# Patient Record
Sex: Female | Born: 1990 | Race: Black or African American | Hispanic: No | Marital: Married | Smoking: Current some day smoker
Health system: Southern US, Community
[De-identification: ages and names within clinical notes are randomized; demographics above are authoritative.]

## PROBLEM LIST (undated history)

## (undated) DIAGNOSIS — M069 Rheumatoid arthritis, unspecified: Secondary | ICD-10-CM

---

## 2014-01-30 ENCOUNTER — Emergency Department (HOSPITAL_COMMUNITY)
Admission: EM | Admit: 2014-01-30 | Discharge: 2014-01-30 | Disposition: A | Discharge status: Home / Self Care | Payer: Medicaid Other | Attending: Emergency Medicine | Admitting: Emergency Medicine

## 2014-01-30 ENCOUNTER — Encounter (HOSPITAL_COMMUNITY): Payer: Self-pay | Admitting: Cardiology

## 2014-01-30 DIAGNOSIS — O0932 Supervision of pregnancy with insufficient antenatal care, second trimester: Secondary | ICD-10-CM | POA: Insufficient documentation

## 2014-01-30 DIAGNOSIS — O2392 Unspecified genitourinary tract infection in pregnancy, second trimester: Secondary | ICD-10-CM | POA: Diagnosis present

## 2014-01-30 DIAGNOSIS — Z3A25 25 weeks gestation of pregnancy: Secondary | ICD-10-CM | POA: Insufficient documentation

## 2014-01-30 DIAGNOSIS — N390 Urinary tract infection, site not specified: Secondary | ICD-10-CM

## 2014-01-30 DIAGNOSIS — Z3490 Encounter for supervision of normal pregnancy, unspecified, unspecified trimester: Secondary | ICD-10-CM

## 2014-01-30 LAB — BASIC METABOLIC PANEL
Anion gap: 10 (ref 5–15)
CO2: 21 mmol/L (ref 19–32)
Calcium: 8.5 mg/dL (ref 8.4–10.5)
Chloride: 101 mEq/L (ref 96–112)
Creatinine, Ser: 0.62 mg/dL (ref 0.50–1.10)
GFR calc Af Amer: >90 mL/min (ref >90)
GLUCOSE: 72 mg/dL (ref 70–99)
POTASSIUM: 3.6 mmol/L (ref 3.5–5.1)
Sodium: 132 mmol/L — ABNORMAL LOW (ref 135–145)

## 2014-01-30 LAB — CBC WITH DIFFERENTIAL/PLATELET
BASOS ABS: 0 10*3/uL (ref 0.0–0.1)
Basophils Relative: 0 % (ref 0–1)
Eosinophils Absolute: 0 10*3/uL (ref 0.0–0.7)
Eosinophils Relative: 0 % (ref 0–5)
HCT: 37.8 % (ref 36.0–46.0)
HEMOGLOBIN: 12.3 g/dL (ref 12.0–15.0)
LYMPHS PCT: 13 % (ref 12–46)
Lymphs Abs: 1.6 10*3/uL (ref 0.7–4.0)
MCH: 26.7 pg (ref 26.0–34.0)
MCHC: 32.5 g/dL (ref 30.0–36.0)
MCV: 82.2 fL (ref 78.0–100.0)
MONOS PCT: 10 % (ref 3–12)
Monocytes Absolute: 1.2 10*3/uL — ABNORMAL HIGH (ref 0.1–1.0)
NEUTROS ABS: 9.5 10*3/uL — AB (ref 1.7–7.7)
Neutrophils Relative %: 77 % (ref 43–77)
Platelets: 262 10*3/uL (ref 150–400)
RBC: 4.6 MIL/uL (ref 3.87–5.11)
RDW: 13.9 % (ref 11.5–15.5)
WBC: 12.4 10*3/uL — ABNORMAL HIGH (ref 4.0–10.5)

## 2014-01-30 LAB — URINALYSIS, ROUTINE W REFLEX MICROSCOPIC
Bilirubin Urine: NEGATIVE
GLUCOSE, UA: NEGATIVE mg/dL
Ketones, ur: 15 mg/dL — AB
NITRITE: NEGATIVE
PH: 6.5 (ref 5.0–8.0)
Protein, ur: NEGATIVE mg/dL
SPECIFIC GRAVITY, URINE: 1.013 (ref 1.005–1.030)
Urobilinogen, UA: 1 mg/dL (ref 0.0–1.0)

## 2014-01-30 LAB — URINE MICROSCOPIC-ADD ON

## 2014-01-30 LAB — I-STAT BETA HCG BLOOD, ED (MC, WL, AP ONLY)

## 2014-01-30 MED ORDER — CEPHALEXIN 500 MG PO CAPS: 500.0000 mg | ORAL_CAPSULE | Freq: Four times a day (QID) | ORAL | Status: DC

## 2014-01-30 MED ORDER — CEFTRIAXONE SODIUM 1 G IJ SOLR
1.0000 g | Freq: Once | INTRAMUSCULAR | Status: AC
  Administered 2014-01-30: 1 g via INTRAMUSCULAR
  Filled 2014-01-30: qty 10

## 2014-01-30 MED ORDER — STERILE WATER FOR INJECTION IJ SOLN
INTRAMUSCULAR | Status: AC
  Administered 2014-01-30: 2.1 mL
  Filled 2014-01-30: qty 10

## 2014-01-30 NOTE — ED Notes (Signed)
Pt reports that she thinks that she is [redacted] weeks pregnant but has no had any prenatal care at this point. States that she would like to have an [place].

## 2014-01-30 NOTE — ED Provider Notes (Signed)
CSN: [id number]     Arrival date & time 01/30/14  1147 History   None    Chief Complaint  Patient presents with  . Pregnancy Ultrasound     (Consider location/radiation/quality/duration/timing/severity/associated sxs/prior Treatment) HPI   Molly Finley is a 24 y.o. female who is G3 P1 last menstrual period approximately 5-1/2 months ago requesting OB ultrasound. Patient states that she chose today to have prenatal care because she was visiting her fianc in the hospital and it was convenient. She denies any vaginal bleeding, abdominal pain, abnormal vaginal discharge, dysuria, hematuria. She is irregularly taking prenatal vitamins. She is a intermittent tobacco user.  Patient states that her OB/GYN's [facility] in [place]   History reviewed. No pertinent past medical history. Past Surgical History  Procedure Laterality Date  . Cesarean section     History reviewed. No pertinent family history. History  Substance Use Topics  . Smoking status: Current Some Day Smoker  . Smokeless tobacco: Not on file  . Alcohol Use: No   OB History    Gravida Para Term Preterm AB TAB SAB Ectopic Multiple Living   1              Review of Systems  10 systems reviewed and found to be negative, except as noted in the HPI.   Allergies  Review of patient's allergies indicates no known allergies.  Home Medications   Prior to Admission medications   Not on File   BP 103/75 mmHg  Pulse 87  Temp(Src) 98.1 F (36.7 C) (Oral)  Resp 18  SpO2 99% Physical Exam  Constitutional: She is oriented to person, place, and time. She appears well-developed and well-nourished. No distress.  HENT:  Head: Normocephalic and atraumatic.  Mouth/Throat: Oropharynx is clear and moist.  Eyes: Conjunctivae and EOM are normal. Pupils are equal, round, and reactive to light.  Neck: Normal range of motion.  Cardiovascular: Normal rate, regular rhythm and intact distal pulses.    Pulmonary/Chest: Effort normal. No stridor.  Abdominal: Soft. Bowel sounds are normal. She exhibits no mass. There is no tenderness. There is no rebound and no guarding.  Gravid with fundal height well above the umbilicus. No significant tenderness to palpation.   Musculoskeletal: Normal range of motion.  Neurological: She is alert and oriented to person, place, and time.  Psychiatric: She has a normal mood and affect.  Nursing note and vitals reviewed.   ED Course  Procedures (including critical care time) Labs Review Labs Reviewed  URINALYSIS, ROUTINE W REFLEX MICROSCOPIC - Abnormal; Notable for the following:    Color, Urine STRAW (*)    APPearance CLOUDY (*)    Hgb urine dipstick MODERATE (*)    Ketones, ur 15 (*)    Leukocytes, UA MODERATE (*)    All other components within normal limits  CBC WITH DIFFERENTIAL - Abnormal; Notable for the following:    WBC 12.4 (*)    Neutro Abs 9.5 (*)    Monocytes Absolute 1.2 (*)    All other components within normal limits  BASIC METABOLIC PANEL - Abnormal; Notable for the following:    Sodium 132 (*)    BUN <5 (*)    All other components within normal limits  URINE MICROSCOPIC-ADD ON - Abnormal; Notable for the following:    Squamous Epithelial / LPF FEW (*)    Bacteria, UA MANY (*)    All other components within normal limits  I-STAT BETA HCG BLOOD, ED ([facility], [facility], [facility] ONLY) -  Abnormal; Notable for the following:    I-stat hCG, quantitative >2000.0 (*)    All other components within normal limits  URINE CULTURE  HIV ANTIBODY (ROUTINE TESTING)    Imaging Review No results found.   EKG Interpretation None      MDM   Final diagnoses:  UTI (lower urinary tract infection)  Pregnancy  Insufficient prenatal care in second trimester    Filed Vitals:   01/30/14 1202  BP: 103/75  Pulse: 87  Temp: 98.1 F (36.7 C)  TempSrc: Oral  Resp: 18  SpO2: 99%    Medications  cefTRIAXone (ROCEPHIN) injection 1 g (1 g Intramuscular  Given 01/30/14 1718)  sterile water (preservative free) injection (2.1 mLs  Given 01/30/14 1719)    Molly Finley is a pleasant 24 y.o. female requesting ultrasound to evaluate her pregnancy. Patient is approximately [redacted] weeks pregnant, she has had no prenatal care. She denies any abdominal pain, vaginal bleeding, abnormal vaginal discharge. States that she's been too busy to obtain prenatal care. Urinalysis is consistent with infection, patient will be given a gram of Ceftin and started on Keflex. Urine culture is ordered. I've had an extensive discussion with this patient on the importance of prenatal care. She states that she will call her OB/GYN tomorrow states that she can get into see them quickly as they manage her 2 other pregnancies.  Bedside ultrasound performed by attending physician Dr. Gwendolyn Grant shows normal fetal heart tones and gestational age by biparietal diameter between 30 and 32 weeks.  Evaluation does not show pathology that would require ongoing emergent intervention or inpatient treatment. Pt is hemodynamically stable and mentating appropriately. Discussed findings and plan with patient/guardian, who agrees with care plan. All questions answered. Return precautions discussed and outpatient follow up given.   Discharge Medication List as of 01/30/2014  5:43 PM    START taking these medications   Details  cephALEXin (KEFLEX) 500 MG capsule Take 1 capsule (500 mg total) by mouth 4 (four) times daily., Starting 01/30/2014, Until Discontinued, [employer], PA-C 01/31/14 0003  Elwin Mocha, MD 01/31/14 [phone]

## 2014-01-30 NOTE — Discharge Instructions (Signed)
Follow with OB/GYN as soon as possible.   Do NOT take any NSAIDs, such as Aspirin, Motrin, Ibuprofen, Aleve, Naproxen etc. Only take Tylenol for pain. Return to the emergency room  for any severe abdominal pain, increasing vaginal bleeding, passing out or repeated vomiting.   Obtain over-the-counter prenatal vitamins. Read the label and make sure that they have at least 400 mcg of folate acid.  Take your antibiotics as directed and to completion. You should never have any leftover antibiotics! Push fluids and stay well hydrated.    Before [facility] Ask any questions about feeding, diapering, and baby care before you leave the hospital. Ask again if you do not understand. Ask when you need to see the doctor again. There are several things you must have before your baby comes home.  Infant car seat.  Crib.  Do not let your baby sleep in a bed with you or anyone else.  If you do not have a bed for your baby, ask the doctor what you can use that will be safe for the baby to sleep in. Infant feeding supplies:  6 to 8 bottles (8 ounce size).  6 to 8 nipples.  Measuring cup.  Measuring tablespoon.  Bottle brush.  Sterilizer (or use any large pan or kettle with a lid).  Formula that contains iron.  A way to boil and cool water. Breastfeeding supplies:  Breast pump.  Nipple cream. Clothing:  24 to 36 cloth diapers and waterproof diaper covers or a box of disposable diapers. You may need as many as 10 to 12 diapers per day.  3 onesies (other clothing will depend on the time of year and the weather).  3 receiving blankets.  3 baby pajamas or gowns.  3 bibs. Bath equipment:  Mild soap.  Petroleum jelly. No baby oil or powder.  Soft cloth towel and washcloth.  Cotton balls.  Separate bath basin for baby. Only sponge bathe until umbilical cord and circumcision are healed. Other supplies:  Thermometer and bulb syringe (ask the hospital to send them home with  you). Ask your doctor about how you should take your baby's temperature.  One to two pacifiers. Prepare for an emergency:  Know how to get to the hospital and know where to admit your baby.  Put all doctor numbers near your house phone and in your cell phone if you have one. Prepare your family:  Talk with siblings about the baby coming home and how they feel about it.  Decide how you want to handle visitors and other family members.  Take offers for help with the baby. You will need time to adjust. Know when to call the doctor.  GET HELP RIGHT AWAY IF:  Your baby's temperature is greater than 100.31F (38C).  The soft spot on your baby's head starts to bulge.  Your baby is crying with no tears or has no wet diapers for 6 hours.  Your baby has rapid breathing.  Your baby is not as alert. Document Released: 12/27/2007 Document Revised: 05/30/2013 Document Reviewed: 04/04/2010 [facility] Patient Information 2015 [place], [place]. This information is not intended to replace advice given to you by your health care provider. Make sure you discuss any questions you have with your health care provider.

## 2014-01-31 LAB — HIV ANTIBODY (ROUTINE TESTING W REFLEX): HIV 1&2 Ab, 4th Generation: NONREACTIVE

## 2014-02-02 LAB — URINE CULTURE

## 2014-02-03 ENCOUNTER — Telehealth (HOSPITAL_COMMUNITY): Payer: Self-pay

## 2014-02-03 NOTE — Telephone Encounter (Signed)
Post ED Visit - Positive Culture Follow-up  Culture report reviewed by antimicrobial stewardship pharmacist: [] Wes Dulaney, Pharm.D., BCPS [] Jeremy Frens, Pharm.D., BCPS [] Elizabeth Martin, Pharm.D., BCPS [] Minh Pham, Pharm.D., BCPS, AAHIVP [x] Michelle Turner, Pharm.D., BCPS, AAHIVP [] Lorie Poole, Pharm.D., BCPS  Positive Urine culture, >/= 100,000 colonies -> E Coli Treated with Cephalexin, organism sensitive to the same and no further patient follow-up is required at this time.  Molly Finley 02/03/2014, 12:07 PM 

## 2015-10-13 ENCOUNTER — Emergency Department (HOSPITAL_COMMUNITY): Payer: No Typology Code available for payment source

## 2015-10-13 ENCOUNTER — Encounter (HOSPITAL_COMMUNITY): Payer: Self-pay | Admitting: Emergency Medicine

## 2015-10-13 ENCOUNTER — Emergency Department (HOSPITAL_COMMUNITY)
Admission: EM | Admit: 2015-10-13 | Discharge: 2015-10-13 | Disposition: A | Discharge status: Home / Self Care | Payer: No Typology Code available for payment source | Attending: Emergency Medicine | Admitting: Emergency Medicine

## 2015-10-13 ENCOUNTER — Encounter (HOSPITAL_COMMUNITY): Payer: Self-pay | Admitting: Radiology

## 2015-10-13 DIAGNOSIS — R0602 Shortness of breath: Secondary | ICD-10-CM | POA: Insufficient documentation

## 2015-10-13 DIAGNOSIS — M791 Myalgia: Secondary | ICD-10-CM | POA: Diagnosis not present

## 2015-10-13 DIAGNOSIS — Y9389 Activity, other specified: Secondary | ICD-10-CM | POA: Insufficient documentation

## 2015-10-13 DIAGNOSIS — R1033 Periumbilical pain: Secondary | ICD-10-CM | POA: Insufficient documentation

## 2015-10-13 DIAGNOSIS — R51 Headache: Secondary | ICD-10-CM | POA: Diagnosis present

## 2015-10-13 DIAGNOSIS — M25512 Pain in left shoulder: Secondary | ICD-10-CM | POA: Diagnosis not present

## 2015-10-13 DIAGNOSIS — Y999 Unspecified external cause status: Secondary | ICD-10-CM | POA: Insufficient documentation

## 2015-10-13 DIAGNOSIS — M25511 Pain in right shoulder: Secondary | ICD-10-CM | POA: Insufficient documentation

## 2015-10-13 DIAGNOSIS — R197 Diarrhea, unspecified: Secondary | ICD-10-CM | POA: Insufficient documentation

## 2015-10-13 DIAGNOSIS — M549 Dorsalgia, unspecified: Secondary | ICD-10-CM | POA: Insufficient documentation

## 2015-10-13 DIAGNOSIS — Y92410 Unspecified street and highway as the place of occurrence of the external cause: Secondary | ICD-10-CM | POA: Insufficient documentation

## 2015-10-13 DIAGNOSIS — R112 Nausea with vomiting, unspecified: Secondary | ICD-10-CM | POA: Insufficient documentation

## 2015-10-13 DIAGNOSIS — M542 Cervicalgia: Secondary | ICD-10-CM | POA: Diagnosis not present

## 2015-10-13 DIAGNOSIS — R079 Chest pain, unspecified: Secondary | ICD-10-CM | POA: Insufficient documentation

## 2015-10-13 DIAGNOSIS — F17200 Nicotine dependence, unspecified, uncomplicated: Secondary | ICD-10-CM | POA: Diagnosis not present

## 2015-10-13 HISTORY — DX: Rheumatoid arthritis, unspecified: M06.9

## 2015-10-13 LAB — COMPREHENSIVE METABOLIC PANEL
ALBUMIN: 4.5 g/dL (ref 3.5–5.0)
ALK PHOS: 44 U/L (ref 38–126)
ALT: 20 U/L (ref 14–54)
AST: 25 U/L (ref 15–41)
Anion gap: 8 (ref 5–15)
BILIRUBIN TOTAL: 0.7 mg/dL (ref 0.3–1.2)
BUN: 10 mg/dL (ref 6–20)
CALCIUM: 9.7 mg/dL (ref 8.9–10.3)
CO2: 28 mmol/L (ref 22–32)
Chloride: 102 mmol/L (ref 101–111)
Creatinine, Ser: 0.71 mg/dL (ref 0.44–1.00)
GFR calc Af Amer: >60 mL/min (ref >60)
GFR calc non Af Amer: >60 mL/min (ref >60)
GLUCOSE: 85 mg/dL (ref 65–99)
Potassium: 3.6 mmol/L (ref 3.5–5.1)
Sodium: 138 mmol/L (ref 135–145)
Total Protein: 8.7 g/dL — ABNORMAL HIGH (ref 6.5–8.1)

## 2015-10-13 LAB — CBC
HCT: 41.6 % (ref 36.0–46.0)
Hemoglobin: 13.5 g/dL (ref 12.0–15.0)
MCH: 27.6 pg (ref 26.0–34.0)
MCHC: 32.5 g/dL (ref 30.0–36.0)
MCV: 84.9 fL (ref 78.0–100.0)
PLATELETS: 291 10*3/uL (ref 150–400)
RBC: 4.9 MIL/uL (ref 3.87–5.11)
RDW: 14.4 % (ref 11.5–15.5)
WBC: 7.5 10*3/uL (ref 4.0–10.5)

## 2015-10-13 LAB — I-STAT BETA HCG BLOOD, ED (MC, WL, AP ONLY)

## 2015-10-13 LAB — TYPE AND SCREEN
ABO/RH(D): O POS
Antibody Screen: NEGATIVE

## 2015-10-13 LAB — I-STAT TROPONIN, ED: Troponin i, poc: 0 ng/mL (ref 0.00–0.08)

## 2015-10-13 LAB — LIPASE, BLOOD: Lipase: 25 U/L (ref 11–51)

## 2015-10-13 LAB — ABO/RH: ABO/RH(D): O POS

## 2015-10-13 MED ORDER — METHOCARBAMOL 500 MG PO TABS
1000.0000 mg | ORAL_TABLET | Freq: Once | ORAL | Status: AC
  Administered 2015-10-13: 1000 mg via ORAL
  Filled 2015-10-13: qty 2

## 2015-10-13 MED ORDER — METHOCARBAMOL 500 MG PO TABS: 500.0000 mg | ORAL_TABLET | Freq: Every evening | ORAL | 0 refills | Status: DC | PRN

## 2015-10-13 MED ORDER — HYDROCODONE-ACETAMINOPHEN 5-325 MG PO TABS: 2.0000 | ORAL_TABLET | ORAL | 0 refills | Status: DC | PRN

## 2015-10-13 MED ORDER — HYDROMORPHONE HCL 1 MG/ML IJ SOLN: 0.5000 mg | Freq: Once | INTRAMUSCULAR | Status: DC

## 2015-10-13 MED ORDER — MORPHINE SULFATE (PF) 2 MG/ML IV SOLN
2.0000 mg | Freq: Once | INTRAVENOUS | Status: AC
Start: 2015-10-13 — End: 2015-10-13
  Administered 2015-10-13: 2 mg via INTRAVENOUS
  Filled 2015-10-13: qty 1

## 2015-10-13 MED ORDER — IOPAMIDOL (ISOVUE-300) INJECTION 61%
100.0000 mL | Freq: Once | INTRAVENOUS | Status: AC | PRN
  Administered 2015-10-13: 100 mL via INTRAVENOUS

## 2015-10-13 MED ORDER — KETOROLAC TROMETHAMINE 30 MG/ML IJ SOLN
30.0000 mg | Freq: Once | INTRAMUSCULAR | Status: AC
  Administered 2015-10-13: 30 mg via INTRAVENOUS
  Filled 2015-10-13: qty 1

## 2015-10-13 MED ORDER — OXYCODONE-ACETAMINOPHEN 5-325 MG PO TABS
1.0000 | ORAL_TABLET | Freq: Once | ORAL | Status: AC
Start: 2015-10-13 — End: 2015-10-13
  Administered 2015-10-13: 1 via ORAL
  Filled 2015-10-13: qty 1
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
## 2015-10-13 NOTE — ED Notes (Signed)
Pt transported to CT ?
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
## 2015-10-13 NOTE — ED Notes (Signed)
Bed: WTR5 Expected date:  Expected time:  Means of arrival:  Comments: 

## 2017-02-24 ENCOUNTER — Encounter (HOSPITAL_COMMUNITY): Payer: Self-pay | Admitting: Emergency Medicine

## 2017-02-24 ENCOUNTER — Emergency Department (HOSPITAL_COMMUNITY)
Admission: EM | Admit: 2017-02-24 | Discharge: 2017-02-25 | Disposition: A | Discharge status: Home / Self Care | Payer: Self-pay | Attending: Emergency Medicine | Admitting: Emergency Medicine

## 2017-02-24 ENCOUNTER — Emergency Department (HOSPITAL_COMMUNITY): Payer: Self-pay

## 2017-02-24 ENCOUNTER — Other Ambulatory Visit: Payer: Self-pay

## 2017-02-24 DIAGNOSIS — R079 Chest pain, unspecified: Secondary | ICD-10-CM | POA: Insufficient documentation

## 2017-02-24 DIAGNOSIS — F17200 Nicotine dependence, unspecified, uncomplicated: Secondary | ICD-10-CM | POA: Insufficient documentation

## 2017-02-24 DIAGNOSIS — M25511 Pain in right shoulder: Secondary | ICD-10-CM | POA: Insufficient documentation

## 2017-02-24 MED ORDER — OXYCODONE-ACETAMINOPHEN 5-325 MG PO TABS
1.0000 | ORAL_TABLET | Freq: Once | ORAL | Status: AC
  Administered 2017-02-25: 1 via ORAL
  Filled 2017-02-24: qty 1

## 2017-02-24 NOTE — ED Triage Notes (Addendum)
Pt arriving from home with complaint of right sided neck and shoulder pain that radiates to her chest x1 week. Pt has been seen for same in the last few days at [facility]. Pt has hx of scoliosis. Pt reports having two masses on his chest wall which were found at [facility] a couple days ago. No aspirin or Nitro given prior to arrvial. Pt denies N/V or shortness of breath.   126/74 99% RA  20g R AC

## 2017-02-25 ENCOUNTER — Encounter (HOSPITAL_COMMUNITY): Payer: Self-pay

## 2017-02-25 ENCOUNTER — Emergency Department (HOSPITAL_COMMUNITY): Payer: Self-pay

## 2017-02-25 LAB — CBC
HCT: 34.7 % — ABNORMAL LOW (ref 36.0–46.0)
HEMOGLOBIN: 11.4 g/dL — AB (ref 12.0–15.0)
MCH: 28.1 pg (ref 26.0–34.0)
MCHC: 32.9 g/dL (ref 30.0–36.0)
MCV: 85.5 fL (ref 78.0–100.0)
Platelets: 258 10*3/uL (ref 150–400)
RBC: 4.06 MIL/uL (ref 3.87–5.11)
RDW: 14.4 % (ref 11.5–15.5)
WBC: 8.4 10*3/uL (ref 4.0–10.5)

## 2017-02-25 LAB — BASIC METABOLIC PANEL
ANION GAP: 5 (ref 5–15)
BUN: 7 mg/dL (ref 6–20)
CALCIUM: 8.7 mg/dL — AB (ref 8.9–10.3)
CHLORIDE: 104 mmol/L (ref 101–111)
CO2: 29 mmol/L (ref 22–32)
Creatinine, Ser: 0.6 mg/dL (ref 0.44–1.00)
GFR calc non Af Amer: >60 mL/min (ref >60)
Glucose, Bld: 88 mg/dL (ref 65–99)
Potassium: 3.5 mmol/L (ref 3.5–5.1)
Sodium: 138 mmol/L (ref 135–145)

## 2017-02-25 LAB — I-STAT BETA HCG BLOOD, ED (MC, WL, AP ONLY)

## 2017-02-25 LAB — I-STAT TROPONIN, ED: TROPONIN I, POC: 0 ng/mL (ref 0.00–0.08)

## 2017-02-25 LAB — D-DIMER, QUANTITATIVE (NOT AT ARMC): D DIMER QUANT: 0.53 ug{FEU}/mL — AB (ref 0.00–0.50)

## 2017-02-25 MED ORDER — IOPAMIDOL (ISOVUE-370) INJECTION 76%
INTRAVENOUS | Status: AC
  Administered 2017-02-25: 100 mL via INTRAVENOUS
  Filled 2017-02-25: qty 100

## 2017-02-25 MED ORDER — IOPAMIDOL (ISOVUE-370) INJECTION 76%
100.0000 mL | Freq: Once | INTRAVENOUS | Status: AC | PRN
  Administered 2017-02-25: 100 mL via INTRAVENOUS

## 2017-02-25 NOTE — Discharge Instructions (Signed)
Follow up with your doctor for pain management. Ibuprofen for pain as needed.

## 2017-02-25 NOTE — ED Provider Notes (Signed)
Shoulder pain chronically Now with left side and chest pain, 2-3 days By exam, c/w musculoskeletal pain However, cannot PERC Panpositive ROS  Pending labs, including d-dimer Received percocet in ED CXR negative  1:20 - labs still not collected. Nursing communication order placed to draw specimens.  3:00 - labs report mildly elevated d-dimer of 0.53. CTA r/o PE ordered.   CTA negative for PE or pulmonary disease. VSS. She has been able to sleep soundly on multiple re-examinations through the night. She can be discharged home and is encouraged to follow up with PCP and pain management as planned.    Elpidio AnisUpstill, Acelynn Dejonge, PA-C 02/25/17 [phone]    Wynetta FinesMessick, Peter C, MD 02/25/17 [phone]

## 2017-02-25 NOTE — ED Provider Notes (Signed)
[facility]-EMERGENCY DEPT Provider Note   CSN: [id number] Arrival date & time: 02/24/17  2001     History   Chief Complaint Chief Complaint  Patient presents with  . Shoulder Pain  . Neck Pain    HPI Molly Finley is a 27 y.o. female who presents today leaning of chronic neck, back, and shoulder pain.  She reports that she has been seen by this for the past few days at [facility] and was reportedly found to have 2 masses on her chest wall. She reports that she has left sided chest pain.  She reports that it is made worse with movement, deep breathing.  She denies any nausea, vomiting, or shortness of breath.  No recent fevers or chills.  She does not have a  Cardiac history.  She reports hormonal birth control.  She is attempting to get into pain management.  She reports that she has not been given any rx for narcotic pain medicine in the past 6 months.  She reports she has tried ibuprofen with out relief.      HPI  Past Medical History:  Diagnosis Date  . Rheumatoid arthritis (HCC)     There are no active problems to display for this patient.   Past Surgical History:  Procedure Laterality Date  . CESAREAN SECTION      OB History    Gravida Para Term Preterm AB Living   1             SAB TAB Ectopic Multiple Live Births                   Home Medications    Prior to Admission medications   Medication Sig Start Date End Date Taking? Authorizing Provider  ALPRAZolam Prudy Feeler) 0.5 MG tablet Take 0.5 mg by mouth 3 (three) times daily as needed for anxiety.   Yes [provider]  HYDROcodone-acetaminophen (NORCO/VICODIN) 5-325 MG tablet Take 2 tablets by mouth every 4 (four) hours as needed. Patient not taking: Reported on 02/24/2017 10/13/15   Bethel Born, PA-C  methocarbamol (ROBAXIN) 500 MG tablet Take 1 tablet (500 mg total) by mouth at bedtime and may repeat dose one time if needed. Patient not taking: Reported on  02/24/2017 10/13/15   Bethel Born, PA-C    Family History No family history on file.  Social History Social History   Tobacco Use  . Smoking status: Current Some Day Smoker  Substance Use Topics  . Alcohol use: No  . Drug use: No     Allergies   Mushroom extract complex and Flexeril [cyclobenzaprine]   Review of Systems Review of Systems  Constitutional: Negative for chills and fever.  Respiratory: Negative for cough and shortness of breath.   Cardiovascular: Positive for chest pain. Negative for palpitations and leg swelling.  Musculoskeletal: Positive for arthralgias, back pain, joint swelling, myalgias and neck pain.  Neurological: Negative for headaches.  All other systems reviewed and are negative.    Physical Exam Updated Vital Signs BP 111/78 (BP Location: Left Arm)   Pulse 85   Temp 98 F (36.7 C) (Oral)   Resp 20   Ht 5\' 8"  (1.727 m)   Wt 59.9 kg (132 lb)   LMP 02/24/2017   SpO2 100%   BMI 20.07 kg/m   Physical Exam  Constitutional: She appears well-developed and well-nourished. No distress.  HENT:  Head: Normocephalic and atraumatic.  Mouth/Throat: Oropharynx is clear and moist.  Eyes:  Conjunctivae are normal. Right eye exhibits no discharge. Left eye exhibits no discharge. No scleral icterus.  Neck: Normal range of motion. No JVD present. No tracheal deviation present.  There is midline TTP, however palpation of paraspinal muscles is more painful.  No nuchal rigidity.    Cardiovascular: Normal rate, regular rhythm, normal heart sounds and intact distal pulses.  Pulmonary/Chest: Effort normal and breath sounds normal. No stridor. No respiratory distress. She has no wheezes. She exhibits tenderness and bony tenderness. She exhibits no mass, no edema, no deformity, no swelling and no retraction. Right breast exhibits no inverted nipple, no mass, no nipple discharge and no skin change. Left breast exhibits no inverted nipple, no mass, no nipple  discharge and no skin change.  Palpation of anterior chest both re-creates and exacerbates her pain in nature.  Breast exam performed with chaperone in room.   Abdominal: Soft. Bowel sounds are normal. She exhibits no distension.  Musculoskeletal: She exhibits no edema or deformity.  There is TTP along back diffusely both midline and laterally.  It is worse in the superior posterior aspect of her bilateral shoulders and over the trapezious muscle distribution.    Neurological: She is alert. She exhibits normal muscle tone.  Skin: Skin is warm and dry. She is not diaphoretic.  Psychiatric: She has a normal mood and affect. Her behavior is normal.  Nursing note and vitals reviewed.    ED Treatments / Results  Labs (all labs ordered are listed, but only abnormal results are displayed) Labs Reviewed  BASIC METABOLIC PANEL  CBC  D-DIMER, QUANTITATIVE (NOT AT [facility])  I-STAT TROPONIN, ED  I-STAT BETA HCG BLOOD, ED ([facility], [facility], [facility] ONLY)    EKG  EKG Interpretation None       Radiology Dg Chest 2 View  Result Date: 02/24/2017 CLINICAL DATA:  Right-sided neck and shoulder pain radiating to the chest for 1 week. EXAM: CHEST  2 VIEW COMPARISON:  CT chest 10/13/2015 FINDINGS: Heart size and pulmonary vascularity are normal. Thoracic scoliosis convex towards the right. Lungs are clear and expanded. No airspace disease or consolidation. No blunting of costophrenic angles. No pneumothorax. Mediastinal contours appear intact. IMPRESSION: No active cardiopulmonary disease. Electronically Signed   By: Burman NievesWilliam  Stevens M.D.   On: 02/24/2017 23:31    Procedures Procedures (including critical care time)  Medications Ordered in ED Medications  oxyCODONE-acetaminophen (PERCOCET/ROXICET) 5-325 MG per tablet 1 tablet (not administered)     Initial Impression / Assessment and Plan / ED Course  I have reviewed the triage vital signs and the nursing notes.  Pertinent labs & imaging results that were  available during my care of the patient were reviewed by me and considered in my medical decision making (see chart for details).    At shift change [facility] who will follow pending studies, re-evaulate and determine disposition.     Final Clinical Impressions(s) / ED Diagnoses   Final diagnoses:  None    ED Discharge Orders    None       Norman ClayHammond, Imanii Gosdin W, PA-C 02/25/17 0047    Wynetta FinesMessick, Peter C, MD 02/25/17 [phone]

## 2018-07-09 DIAGNOSIS — D72829 Elevated white blood cell count, unspecified: Secondary | ICD-10-CM

## 2018-07-09 DIAGNOSIS — I340 Nonrheumatic mitral (valve) insufficiency: Secondary | ICD-10-CM

## 2018-07-09 DIAGNOSIS — L03114 Cellulitis of left upper limb: Secondary | ICD-10-CM

## 2018-07-09 DIAGNOSIS — F1990 Other psychoactive substance use, unspecified, uncomplicated: Secondary | ICD-10-CM

## 2018-07-09 DIAGNOSIS — L02414 Cutaneous abscess of left upper limb: Secondary | ICD-10-CM

## 2018-09-06 IMAGING — CT CT ANGIO CHEST
2 of 6 series · 18 of 46 positions shown · IV contrast (ISOVUE)
Comparison: CT chest from [HOSPITAL] 10/13/2015. CT chest
from red off Hospital 02/20/2017

CLINICAL DATA: Right shoulder and neck pain radiating into the
chest for 1 week.

EXAM:
CT ANGIOGRAPHY CHEST WITH CONTRAST
TECHNIQUE: Multidetector CT imaging of the chest was performed using the
standard protocol during bolus administration of intravenous
contrast. Multiplanar CT image reconstructions and MIPs were
obtained to evaluate the vascular anatomy.
CONTRAST:  100 mL Isovue 370

[Series 8: thins · axial · 0.62mm/px · z∈[+1498,+1738]mm · 15 of 264 slices shown]
[im 12/264  lung]
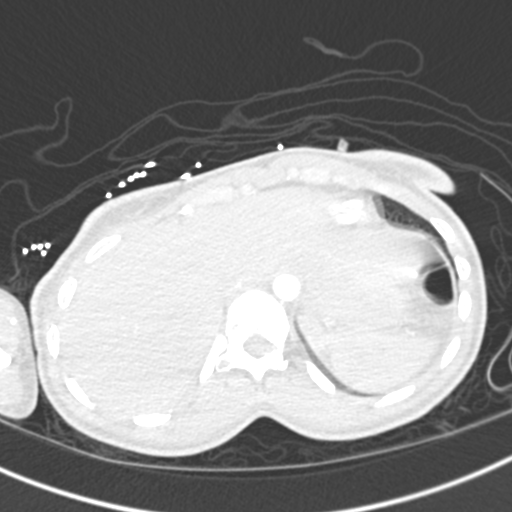
[im 35/264  soft-tissue]
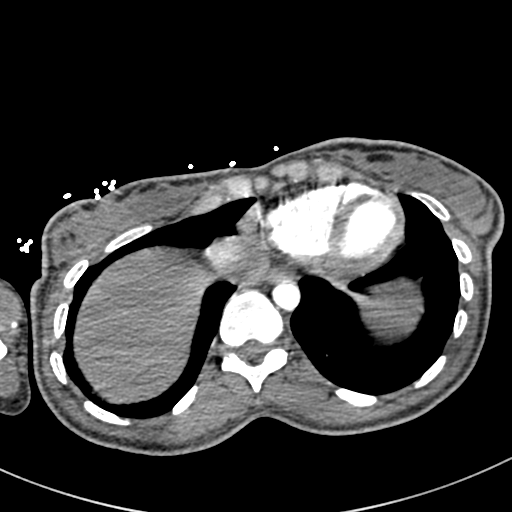
[im 46/264  lung]
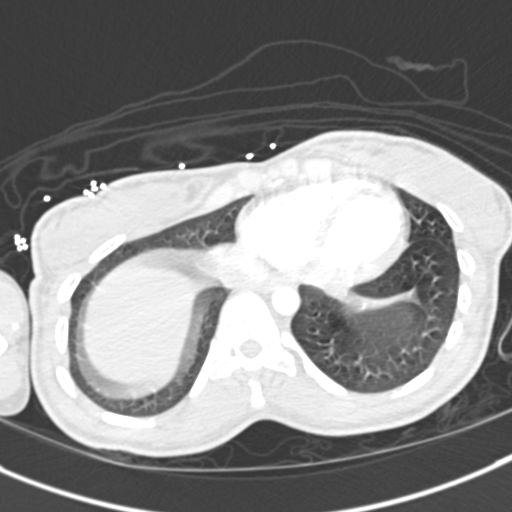
[im 69/264  soft-tissue]
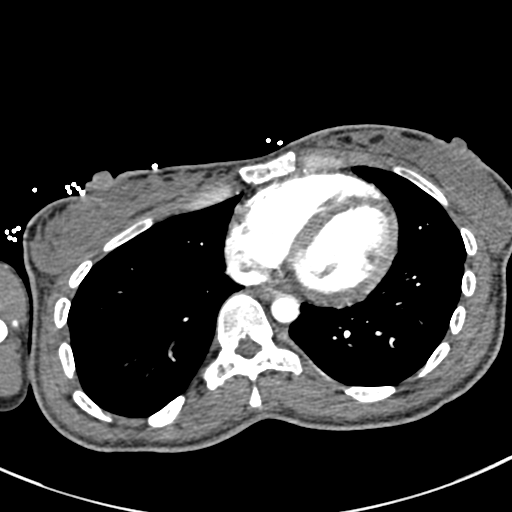
[im 81/264  lung]
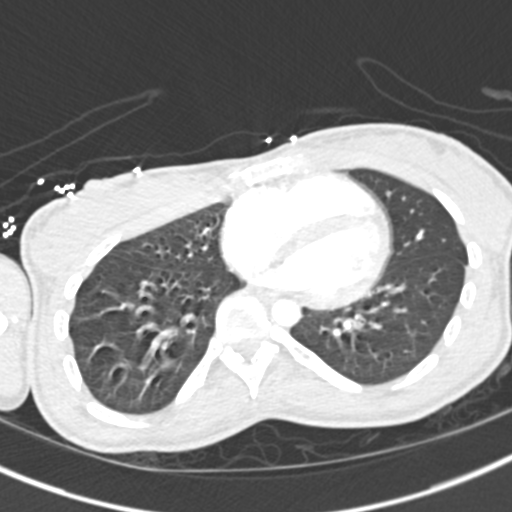
[im 103/264  soft-tissue]
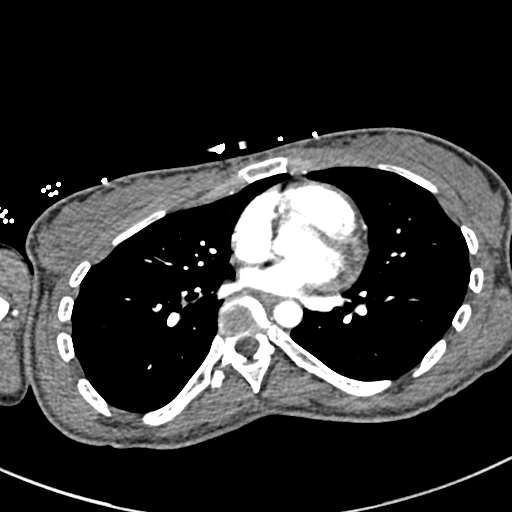
[im 115/264  lung]
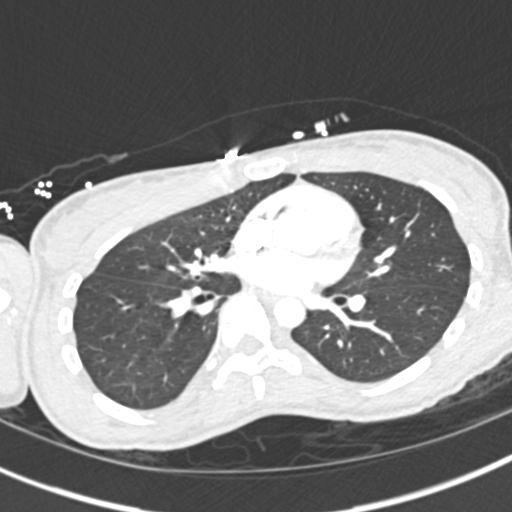
[im 138/264  soft-tissue]
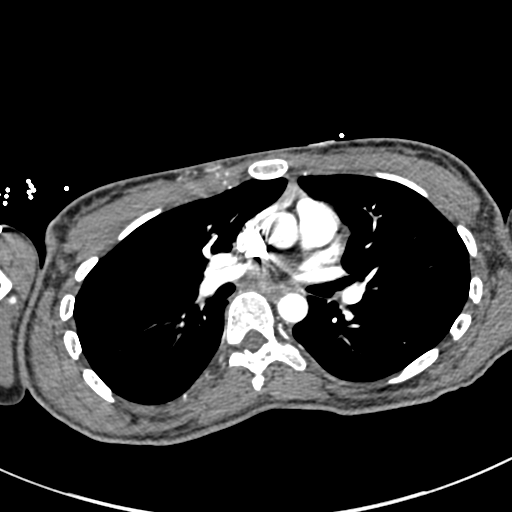
[im 149/264  lung]
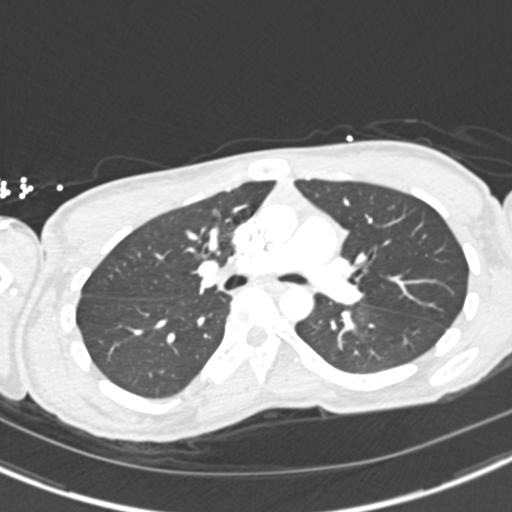
[im 161/264  soft-tissue]
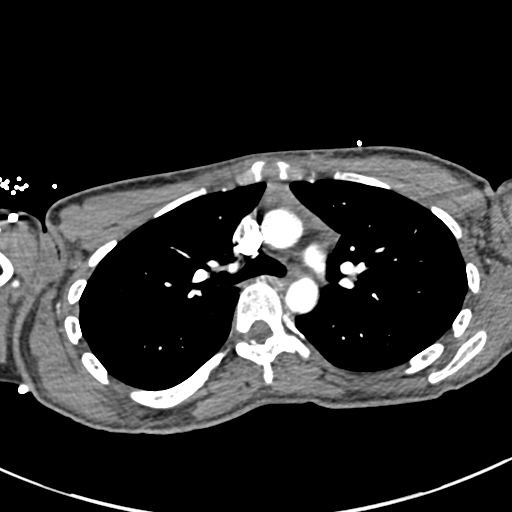
[im 183/264  lung]
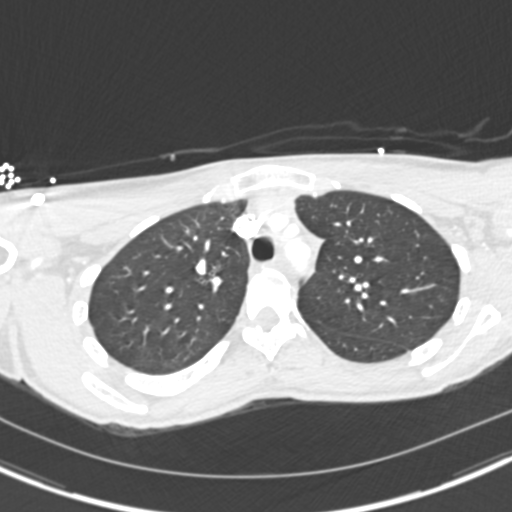
[im 195/264  soft-tissue]
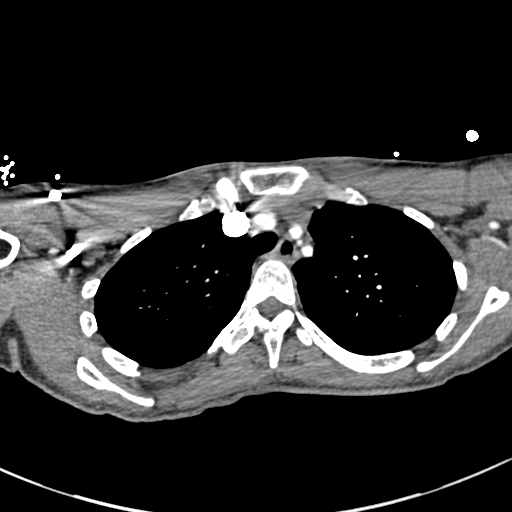
[im 218/264  lung]
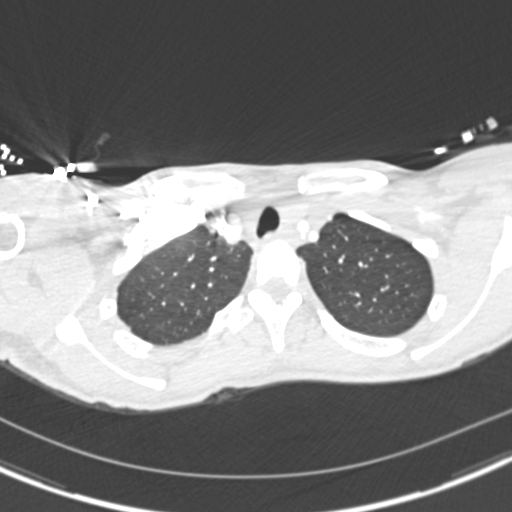
[im 229/264  soft-tissue]
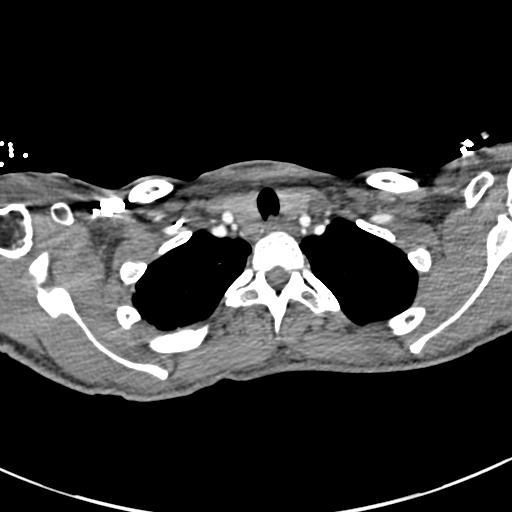
[im 252/264  lung]
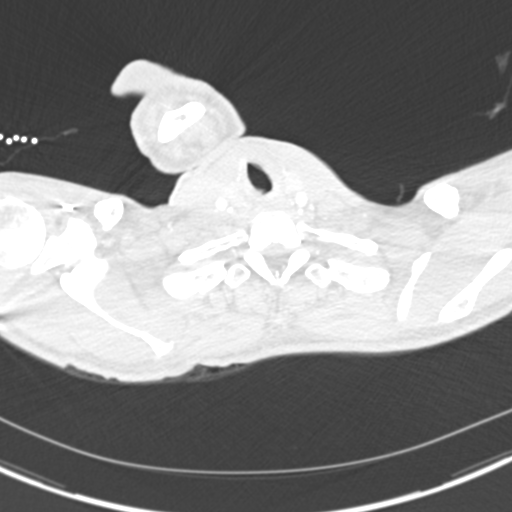

[Series 10: coronal mpr · coronal · 0.53mm/px · 3 of 86 slices shown]
[im 22/86  soft-tissue]
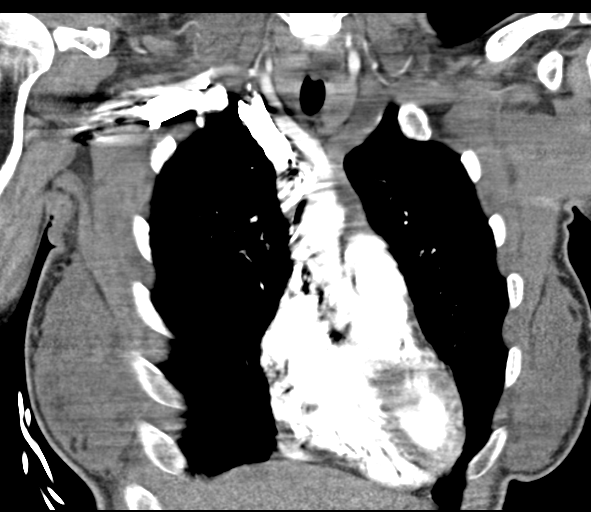
[im 43/86  soft-tissue]
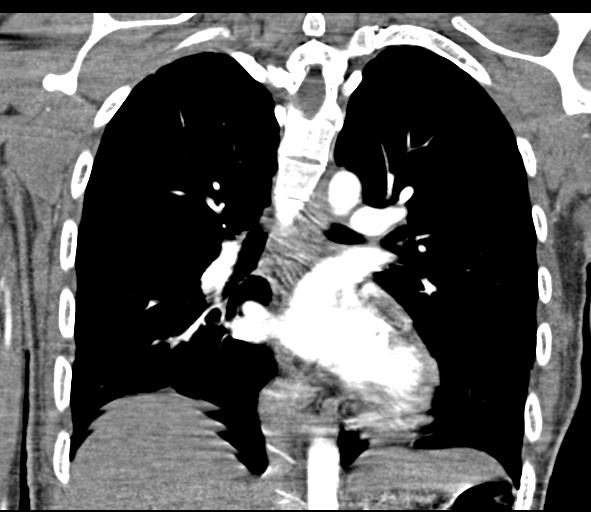
[im 64/86  soft-tissue]
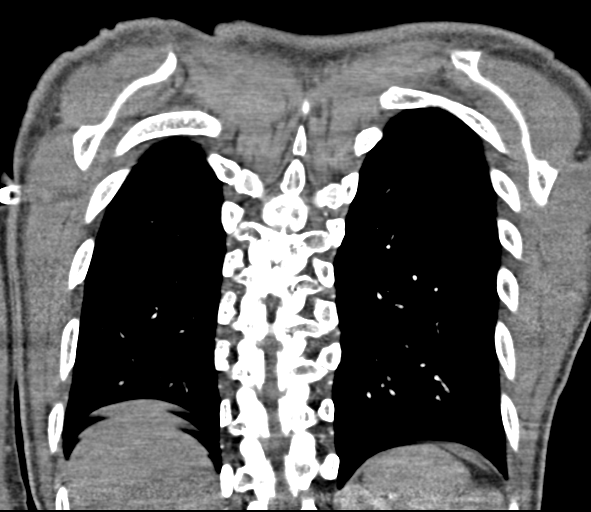

[18 of 46 positions shown; findings below may reference images not displayed]

FINDINGS: Cardiovascular: Good opacification of the central and proximal
segmental pulmonary arteries. No focal filling defects. No
significant pulmonary embolus. Normal heart size. No pericardial
effusion. Normal caliber thoracic aorta. No dissection. Great vessel
origins are patent.

Mediastinum/Nodes: Residual thymic tissue in the anterior
mediastinum. No significant lymphadenopathy in the chest. Esophagus
is decompressed.

Lungs/Pleura: Lungs are clear and expanded. No airspace disease or
consolidation. No pleural effusions. No pneumothorax. Airways are
patent. Tiny nodular opacity within the right inner hemispheric
fissure likely representing a lymph node. No change since prior
studies.

Upper Abdomen: No acute abnormality.

Musculoskeletal: No chest wall abnormality. No acute or significant
osseous findings.

Review of the MIP images confirms the above findings.
IMPRESSION: 1. No evidence of significant pulmonary embolus.
2. No evidence of active pulmonary disease.
3. No significant change since previous studies.

## 2019-01-10 DIAGNOSIS — I959 Hypotension, unspecified: Secondary | ICD-10-CM

## 2019-01-10 DIAGNOSIS — E871 Hypo-osmolality and hyponatremia: Secondary | ICD-10-CM

## 2019-01-10 DIAGNOSIS — F1990 Other psychoactive substance use, unspecified, uncomplicated: Secondary | ICD-10-CM

## 2019-01-10 DIAGNOSIS — A419 Sepsis, unspecified organism: Secondary | ICD-10-CM

## 2019-01-10 DIAGNOSIS — J189 Pneumonia, unspecified organism: Secondary | ICD-10-CM

## 2019-01-10 DIAGNOSIS — D72829 Elevated white blood cell count, unspecified: Secondary | ICD-10-CM

## 2019-01-10 DIAGNOSIS — R509 Fever, unspecified: Secondary | ICD-10-CM

## 2019-01-11 DIAGNOSIS — A400 Sepsis due to streptococcus, group A: Secondary | ICD-10-CM

## 2019-01-11 DIAGNOSIS — R6520 Severe sepsis without septic shock: Secondary | ICD-10-CM

## 2021-02-15 DIAGNOSIS — I071 Rheumatic tricuspid insufficiency: Secondary | ICD-10-CM

## 2021-02-16 DIAGNOSIS — R000 Tachycardia, unspecified: Secondary | ICD-10-CM

## 2021-02-17 DIAGNOSIS — D696 Thrombocytopenia, unspecified: Secondary | ICD-10-CM

## 2021-02-17 DIAGNOSIS — D638 Anemia in other chronic diseases classified elsewhere: Secondary | ICD-10-CM

## 2021-02-19 DIAGNOSIS — I071 Rheumatic tricuspid insufficiency: Secondary | ICD-10-CM

## 2021-02-19 DIAGNOSIS — I2699 Other pulmonary embolism without acute cor pulmonale: Secondary | ICD-10-CM

## 2021-02-19 DIAGNOSIS — A419 Sepsis, unspecified organism: Secondary | ICD-10-CM

## 2021-02-19 DIAGNOSIS — I38 Endocarditis, valve unspecified: Secondary | ICD-10-CM

## 2022-12-22 ENCOUNTER — Ambulatory Visit (HOSPITAL_COMMUNITY)
Admission: EM | Admit: 2022-12-22 | Discharge: 2022-12-22 | Disposition: A | Discharge status: Home / Self Care | Payer: MEDICAID

## 2022-12-22 DIAGNOSIS — Z7689 Persons encountering health services in other specified circumstances: Secondary | ICD-10-CM

## 2022-12-22 NOTE — ED Provider Notes (Signed)
Behavioral Health Urgent Care Medical Screening Exam  Patient Name: Molly Finley MRN: [id number] Date of Evaluation: 12/22/22 Chief Complaint:   Diagnosis:  Final diagnoses:  Encounter to establish care    History of Present Illness  Molly Finley is a 32 y.o. 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  Patient states she was just recently discharged from [facility] after completing her substance rehabilitation program and was sent to [facility] [place] to establish continued follow-up of her sobriety. Upon arrival, she reports that she was turned away by [facility] because "I was not accepted. They did not communicate this with [facility] even though I talked to them for their intake appointments." Patient was subsequently sent [facility] to [facility] to establish outpatient care.  Per patient, she has all her medications. She reports not relapsing into substance use after leaving [facility] and denies the need to detox. She denies suicidal or homicidal thoughts. She denies hearing voices or seeing things at present. She would like to get connected to outpatient services for medication management.  Patient was amenable to receiving outpatient resources near [place].  Psychiatric Specialty Exam Presentation  General Appearance: Appropriate for Environment; Well Groomed   Eye Contact:Good   Speech:Clear and Coherent; Normal Rate   Speech Volume:Normal   Handedness:No data recorded  Mood and Affect  Mood:-- ("fine")   Affect:Appropriate; Congruent;  Restricted   Thought Process  Thought Processes:Coherent; Linear; Goal Directed   Descriptions of Associations:Intact   Orientation:Full (Time, Place and Person)   Thought Content:Logical; WDL  Diagnosis of Schizophrenia or Schizoaffective disorder in past: No data recorded   Hallucinations:Hallucinations: None   Ideas of Reference:None   Suicidal Thoughts:Suicidal Thoughts: No   Homicidal Thoughts:Homicidal Thoughts: No   Sensorium  Memory:Immediate Good; Recent Good; Remote Good   Judgment:Good   Insight:Fair   Executive Functions  Concentration:Good   Attention Span:Good   Recall:Good   Fund of Knowledge:Good   Language:Good   Psychomotor Activity  Psychomotor Activity:Psychomotor Activity: Normal   Assets  Assets:Resilience   Sleep  Sleep:Sleep: Good   Number of hours: No data recorded  Physical Exam: Physical Exam Vitals and nursing note reviewed.  HENT:     Head: Normocephalic and atraumatic.  Pulmonary:     Effort: Pulmonary effort is normal.  Musculoskeletal:     Cervical back: Normal range of motion.  Neurological:     General: No focal deficit present.     Mental Status: She is alert.    Review of Systems  Constitutional: Negative.   Respiratory: Negative.    Cardiovascular: Negative.   Gastrointestinal: Negative.   Genitourinary: Negative.   Psychiatric/Behavioral:         Psychiatric subjective data addressed in PSE or HPI / daily subjective report   Blood pressure 124/63, pulse 85, temperature 98 F (36.7 C), temperature source Oral, resp. rate 18, SpO2 100%. There is no height or weight on file to calculate BMI.  Musculoskeletal: Strength & Muscle Tone: within normal limits Gait & Station: normal Patient leans: N/A  [facility] MSE Discharge Disposition for Follow up and Recommendations: Based on my evaluation the patient does not appear to have  an emergency medical condition and can be discharged with  resources and follow up care in outpatient services for Medication Management, Substance Abuse Intensive Outpatient Program, Individual Therapy, and Group Therapy  Patient not in a mental health crisis. She is not a danger to self or others. She may be discharged safely with outpatient psychiatry / therapy / substance use follow-up.  Augusto Gamble, MD 12/22/2022, 11:45 AM

## 2022-12-22 NOTE — Discharge Summary (Signed)
Molly Finley to be D/C'd Home per MD order. Discussed with the patient and all questions fully answered. An After Visit Summary was printed and given to the patient. Patient escorted out and D/C home via taxicab.  Dickie La  12/22/2022 12:01 PM

## 2022-12-22 NOTE — Discharge Instructions (Addendum)
Dear Molly Finley,  It was a pleasure to take care of you during your visit at [facility]) where you were provided resources  Please review the medication list provided to you at discharge and stop, start taking, or continue taking the medications listed there.  You should also follow-up with your primary care doctor, or start seeing one if you don't have one yet. If applicable, here are some scheduled follow-ups for you:  Follow-up Information     [facility].   Contact information: [address]  [place] [address]  [phone]                 I recommend abstinence from alcohol, tobacco, and other illicit drug use.   If your psychiatric symptoms or suicidal thoughts recur, worsen, or if you have side effects to your psychiatric medications, call your outpatient psychiatric provider, 911, [phone] or go to the nearest emergency department.  Take care!  Signed: Augusto Gamble, MD 12/22/2022, 11:36 AM  Naloxone (Narcan) can help reverse an overdose when given to the victim quickly.  [place] offers free naloxone kits and instructions/training on its use.  Add naloxone to your first aid kit and you can help save a life. A prescription can be filled at your local pharmacy or free kits are provided by the county.  Pick up your free kit at the following locations:   [place]:  [facility], [address] [place] [address] [phone]) [facility] [address] [place] [address] [phone]) [facility] [address] [place] [address]  [place]: [facility] of [facility] [address] [place] [address] ([phone]) [facility] [address] [place] [address] [phone])

## 2022-12-22 NOTE — Progress Notes (Signed)
   12/22/22 1100  [facility] Triage Screening (Walk-ins at [facility] only)  What Is the Reason for Your Visit/Call Today? Molly Finley is a 32 year old female who presents to [facility] unaccompanied seeking additional resources for substance use and medication management. Pt states she was discharged from [facility] today after completing her program. Pt states she was originally referred to [facility] for medication management and substance use treatment but states she did not meet the requirements to go there. She states she was then referred to this facility for additional assistance. Pt reports being diagnosed with Schizoprenia, Bi-polar disorder, PTSD, depression/anxiety. Pt states she lives in [place] with her grandma, mom, and 2 kids. Pt reports a past psychiatric hospitalization about 10 years ago for hallucinations. She denies past suicide attempts. Pt Pt denies SI/HI and AVH.  How Long Has This Been Causing You Problems? <Week  Have You Recently Had Any Thoughts About Hurting Yourself? No  Are You Planning to Commit Suicide/Harm Yourself At This time? No  Have you Recently Had Thoughts About Hurting Someone Karolee Ohs? No  Are You Planning To Harm Someone At This Time? No  Physical Abuse  (did not disclose)  Verbal Abuse  (did not disclose)  Sexual Abuse  (did not disclose)  Exploitation of patient/patient's resources  (did not disclose)  Self-Neglect  (did not disclose)  Possible abuse reported to:  (did not disclose)  Are you currently experiencing any auditory, visual or other hallucinations? No  Have You Used Any Alcohol or Drugs in the Past 24 Hours? No  Do you have any current medical co-morbidities that require immediate attention? No  Clinician description of patient physical appearance/behavior: calm ,cooperative  What Do You Feel Would Help You the Most Today? Alcohol or Drug Use Treatment;Treatment for Depression or other mood problem  If access to [facility] was not available,  would you have sought care in the Emergency Department? No  Determination of Need Routine (7 days)  Options For Referral Outpatient Therapy;Medication Management

## 2023-04-10 ENCOUNTER — Emergency Department (HOSPITAL_COMMUNITY): Payer: MEDICAID

## 2023-04-10 ENCOUNTER — Emergency Department (HOSPITAL_COMMUNITY)
Admission: EM | Admit: 2023-04-10 | Discharge: 2023-04-10 | Disposition: A | Discharge status: Home / Self Care | Payer: MEDICAID | Attending: Student | Admitting: Student

## 2023-04-10 ENCOUNTER — Other Ambulatory Visit: Payer: Self-pay

## 2023-04-10 DIAGNOSIS — R0602 Shortness of breath: Secondary | ICD-10-CM | POA: Diagnosis not present

## 2023-04-10 DIAGNOSIS — Z950 Presence of cardiac pacemaker: Secondary | ICD-10-CM | POA: Insufficient documentation

## 2023-04-10 DIAGNOSIS — R079 Chest pain, unspecified: Secondary | ICD-10-CM | POA: Insufficient documentation

## 2023-04-10 DIAGNOSIS — R109 Unspecified abdominal pain: Secondary | ICD-10-CM | POA: Diagnosis present

## 2023-04-10 LAB — CBC WITH DIFFERENTIAL/PLATELET
Abs Immature Granulocytes: 0 10*3/uL (ref 0.00–0.07)
Basophils Absolute: 0 10*3/uL (ref 0.0–0.1)
Basophils Relative: 0 %
Eosinophils Absolute: 0 10*3/uL (ref 0.0–0.5)
Eosinophils Relative: 1 %
HCT: 33.6 % — ABNORMAL LOW (ref 36.0–46.0)
Hemoglobin: 10.6 g/dL — ABNORMAL LOW (ref 12.0–15.0)
Immature Granulocytes: 0 %
Lymphocytes Relative: 26 %
Lymphs Abs: 1.3 10*3/uL (ref 0.7–4.0)
MCH: 25.3 pg — ABNORMAL LOW (ref 26.0–34.0)
MCHC: 31.5 g/dL (ref 30.0–36.0)
MCV: 80.2 fL (ref 80.0–100.0)
Monocytes Absolute: 0.4 10*3/uL (ref 0.1–1.0)
Monocytes Relative: 8 %
Neutro Abs: 3.1 10*3/uL (ref 1.7–7.7)
Neutrophils Relative %: 65 %
Platelets: 212 10*3/uL (ref 150–400)
RBC: 4.19 MIL/uL (ref 3.87–5.11)
RDW: 18.6 % — ABNORMAL HIGH (ref 11.5–15.5)
WBC: 4.9 10*3/uL (ref 4.0–10.5)
nRBC: 0 % (ref 0.0–0.2)

## 2023-04-10 LAB — URINALYSIS, ROUTINE W REFLEX MICROSCOPIC
Bacteria, UA: NONE SEEN
Bilirubin Urine: NEGATIVE
Glucose, UA: NEGATIVE mg/dL
Ketones, ur: NEGATIVE mg/dL
Leukocytes,Ua: NEGATIVE
Nitrite: NEGATIVE
Protein, ur: NEGATIVE mg/dL
Specific Gravity, Urine: 1.016 (ref 1.005–1.030)
pH: 6 (ref 5.0–8.0)

## 2023-04-10 LAB — C-REACTIVE PROTEIN: CRP: <0.5 mg/dL (ref <1.0)

## 2023-04-10 LAB — COMPREHENSIVE METABOLIC PANEL
ALT: 16 U/L (ref 0–44)
AST: 28 U/L (ref 15–41)
Albumin: 3.2 g/dL — ABNORMAL LOW (ref 3.5–5.0)
Alkaline Phosphatase: 47 U/L (ref 38–126)
Anion gap: 10 (ref 5–15)
BUN: 9 mg/dL (ref 6–20)
CO2: 25 mmol/L (ref 22–32)
Calcium: 8.5 mg/dL — ABNORMAL LOW (ref 8.9–10.3)
Chloride: 104 mmol/L (ref 98–111)
Creatinine, Ser: 0.92 mg/dL (ref 0.44–1.00)
GFR, Estimated: >60 mL/min (ref >60)
Glucose, Bld: 87 mg/dL (ref 70–99)
Potassium: 3.9 mmol/L (ref 3.5–5.1)
Sodium: 139 mmol/L (ref 135–145)
Total Bilirubin: 1 mg/dL (ref 0.0–1.2)
Total Protein: 6.7 g/dL (ref 6.5–8.1)

## 2023-04-10 LAB — RESP PANEL BY RT-PCR (RSV, FLU A&B, COVID)  RVPGX2
Influenza A by PCR: NEGATIVE
Influenza B by PCR: NEGATIVE
Resp Syncytial Virus by PCR: NEGATIVE
SARS Coronavirus 2 by RT PCR: NEGATIVE

## 2023-04-10 LAB — HCG, SERUM, QUALITATIVE: Preg, Serum: NEGATIVE

## 2023-04-10 LAB — I-STAT CG4 LACTIC ACID, ED
Lactic Acid, Venous: 0.8 mmol/L (ref 0.5–1.9)
Lactic Acid, Venous: 0.6 mmol/L (ref 0.5–1.9)

## 2023-04-10 LAB — SEDIMENTATION RATE: Sed Rate: 8 mm/h (ref 0–22)

## 2023-04-10 LAB — RAPID URINE DRUG SCREEN, HOSP PERFORMED
Amphetamines: POSITIVE — AB
Barbiturates: NOT DETECTED
Benzodiazepines: NOT DETECTED
Cocaine: NOT DETECTED
Opiates: NOT DETECTED
Tetrahydrocannabinol: NOT DETECTED

## 2023-04-10 LAB — TROPONIN I (HIGH SENSITIVITY)
Troponin I (High Sensitivity): 4 ng/L (ref <18)
Troponin I (High Sensitivity): 6 ng/L (ref <18)

## 2023-04-10 LAB — LIPASE, BLOOD: Lipase: 30 U/L (ref 11–51)

## 2023-04-10 MED ORDER — IOHEXOL 350 MG/ML SOLN
75.0000 mL | Freq: Once | INTRAVENOUS | Status: AC | PRN
  Administered 2023-04-10: 75 mL via INTRAVENOUS

## 2023-04-10 NOTE — ED Provider Notes (Signed)
 [place] EMERGENCY DEPARTMENT AT [facility] Provider Note   CSN: [id number] Arrival date & time: 04/10/23  1236     History  Chief Complaint  Patient presents with   Abdominal Pain    Molly Finley is a 33 y.o. 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  The patient presents to the ED for evaluation of abdominal pain, nausea, vomiting, chest pain, shortness of breath.  Patient reports that her abdominal pain began about a week ago.  She is endorsing nausea, vomiting but denies diarrhea, dysuria, fevers, flank pain, vaginal discharge.  Reports she is currently on her menstrual cycle.  Patient also complaining of chest pain that began "a few days ago".  States that she has associated shortness of breath.  She reports that she has a history of valvular carditis due to IV drug use.  Reports that she is currently on penicillin for this.  Does see [facility] infectious disease but has not been able to follow-up with them due to "not having a ride".  Reports that she lives in [place], gets her care through [facility].  She denies fevers, body aches or chills, lightheadedness, dizziness, weakness, leg swelling.  Reports compliance on penicillin antibiotics.  Patient reports that she used IV heroin "a few days" ago.  The patient denies being on blood thinners.  In her chart, she was diagnosed with acute septic PE on 02/22/2021.  Patient also has epicardial pacemaker.  Patient also complaining of back pain, has chronic back pain listed in her chart however states that this back pain is new.  Reports that it is low back pain.  Denies red flag symptoms.   Abdominal Pain Associated symptoms: chest  pain, nausea, shortness of breath and vomiting   Associated symptoms: no diarrhea, no dysuria and no fever        Home Medications Prior to Admission medications   Medication Sig Start Date End Date Taking? Authorizing Provider  ALPRAZolam Prudy Feeler) 0.5 MG tablet Take 0.5 mg by mouth 3 (three) times daily as needed for anxiety.    [provider]      Allergies    Mushroom extract complex (obsolete) and Flexeril [cyclobenzaprine]    Review of Systems   Review of Systems  Constitutional:  Negative for fever.  Respiratory:  Positive for shortness of breath.   Cardiovascular:  Positive for chest pain. Negative for leg swelling.  Gastrointestinal:  Positive for abdominal pain, nausea and vomiting. Negative for blood in stool and diarrhea.  Genitourinary:  Negative for dysuria and flank pain.  Musculoskeletal:  Positive for back pain.  All other systems reviewed and are negative.   Physical Exam Updated Vital Signs BP (!) 161/91 (BP Location: Right Arm)   Pulse 73   Temp 99.4 F (37.4 C) (Oral)   Resp 18   SpO2 100%  Physical Exam Vitals and nursing note reviewed.  Constitutional:      General: She is not in acute distress.    Appearance: She is not toxic-appearing or diaphoretic.  HENT:     Nose: Nose normal.     Mouth/Throat:     Mouth: Mucous membranes are moist.     Pharynx: Oropharynx is clear.  Eyes:     Extraocular Movements: Extraocular movements  intact.     Conjunctiva/sclera: Conjunctivae normal.     Pupils: Pupils are equal, round, and reactive to light.  Cardiovascular:     Rate and Rhythm: Normal rate and regular rhythm.  Pulmonary:     Effort: Pulmonary effort is normal.     Breath sounds: Normal breath sounds. No wheezing.  Abdominal:     General: Abdomen is flat. Bowel sounds are normal.     Palpations: Abdomen is soft.     Tenderness: There is no abdominal tenderness.     Comments: Abdomen soft and nontender, no rebound or guarding.   Musculoskeletal:     Cervical back: Normal range of motion and neck supple. No tenderness.  Skin:    General: Skin is warm and dry.     Capillary Refill: Capillary refill takes less than 2 seconds.  Neurological:     Mental Status: She is oriented to person, place, and time. She is lethargic.     ED Results / Procedures / Treatments   Labs (all labs ordered are listed, but only abnormal results are displayed) Labs Reviewed  CBC WITH DIFFERENTIAL/PLATELET - Abnormal; Notable for the following components:      Result Value   Hemoglobin 10.6 (*)    HCT 33.6 (*)    MCH 25.3 (*)    RDW 18.6 (*)    All other components within normal limits  COMPREHENSIVE METABOLIC PANEL - Abnormal; Notable for the following components:   Calcium 8.5 (*)    Albumin 3.2 (*)    All other components within normal limits  URINALYSIS, ROUTINE W REFLEX MICROSCOPIC - Abnormal; Notable for the following components:   Hgb urine dipstick MODERATE (*)    All other components within normal limits  RAPID URINE DRUG SCREEN, HOSP PERFORMED - Abnormal; Notable for the following components:   Amphetamines POSITIVE (*)    All other components within normal limits  RESP PANEL BY RT-PCR (RSV, FLU A&B, COVID)  [id number]  CULTURE, BLOOD (ROUTINE X 2)  CULTURE, BLOOD (ROUTINE X 2)  LIPASE, BLOOD  HCG, SERUM, QUALITATIVE  SEDIMENTATION RATE  C-REACTIVE PROTEIN  I-STAT CG4 LACTIC ACID, ED  I-STAT CG4 LACTIC ACID, ED  TROPONIN I (HIGH SENSITIVITY)  TROPONIN I (HIGH SENSITIVITY)    EKG None  Radiology No results found.  Procedures Procedures   Medications Ordered in ED Medications  iohexol (OMNIPAQUE) 350 MG/ML injection 75 mL (75 mLs Intravenous Contrast Given 04/10/23 1642)    ED Course/ Medical Decision Making/ A&P Clinical Course as of 04/10/23 1742  Fri Apr 10, 2023  1251 Valvular endocarditis, hx of substance abuse, pacemaker [CG]    Clinical Course User Index [CG] Al Decant, PA-C    Medical Decision Making Amount and/or Complexity of Data Reviewed Labs: ordered. Radiology: ordered.  Risk Prescription drug management.   33 year old female presents for evaluation.  Please see HPI for further details.  On examination patient is afebrile, nontachycardic.  Her lung sounds are clear bilaterally, she is not hypoxic.  Her abdomen is soft and compressible throughout.  There is no tenderness noted.  There is no rebound or guarding.  Neurological examinations at baseline.  No edema in bilateral lower extremities.  Patient has extensive comorbidities.  Will collect extensive labs.  Will collect CBC, CMP, blood cultures, lactic's, CRP, sed rate, urinalysis, UDS, lipase, viral panel, CTA, CT lumbar spine with contrast.  CBC without leukocytosis, hemoglobin 10.6.  Patient CMP without electrolyte derangement, no elevated LFTs, anion gap 10.  Lipase  WNL.  Viral panel negative for all.  UDS shows positive for amphetamines however no positive for opiates.  CRP not elevated.  Sed rate not elevated.  Lactic acid not elevated.  Troponin 4, delta 6. EKG pending.  CTA pending.  Lumbar spine with contrast pending.  Signed out to Dr. Fredderick Phenix.   Final Clinical Impression(s) / ED Diagnoses Final diagnoses:  Abdominal pain, unspecified abdominal location  Chest pain, unspecified type  Shortness of breath    Rx / DC Orders ED Discharge Orders     None         Al Decant, PA-C 04/10/23 1742    Rolan Bucco, MD 04/10/23 1610    Rolan Bucco, MD 04/10/23 2106

## 2023-04-10 NOTE — Discharge Instructions (Signed)
 Follow-up with your infectious disease doctor.  Return to the emergency room if you have any worsening symptoms.

## 2023-04-10 NOTE — ED Triage Notes (Addendum)
 Patient Molly Finley for abd pain starting 4 days ago. Nausea, headache, cough, and shortness of breath.  Patient has a pacemaker and states "I think my heart valve is infected". A&O and ambulatory.

## 2023-04-15 LAB — CULTURE, BLOOD (ROUTINE X 2)
Culture: NO GROWTH
Culture: NO GROWTH

## 2024-01-12 DIAGNOSIS — I351 Nonrheumatic aortic (valve) insufficiency: Secondary | ICD-10-CM | POA: Diagnosis not present

## 2024-01-12 DIAGNOSIS — I340 Nonrheumatic mitral (valve) insufficiency: Secondary | ICD-10-CM | POA: Diagnosis not present
